# Patient Record
Sex: Female | Born: 1955 | Race: White | Hispanic: No | Marital: Married | Smoking: Never smoker
Health system: Southern US, Community
[De-identification: ages and names within clinical notes are randomized; demographics above are authoritative.]

## PROBLEM LIST (undated history)

## (undated) DIAGNOSIS — S0300XA Dislocation of jaw, unspecified side, initial encounter: Secondary | ICD-10-CM

## (undated) DIAGNOSIS — I10 Essential (primary) hypertension: Secondary | ICD-10-CM

## (undated) DIAGNOSIS — M3500 Sicca syndrome, unspecified: Secondary | ICD-10-CM

## (undated) DIAGNOSIS — F419 Anxiety disorder, unspecified: Secondary | ICD-10-CM

## (undated) DIAGNOSIS — I712 Thoracic aortic aneurysm, without rupture, unspecified: Secondary | ICD-10-CM

## (undated) DIAGNOSIS — F32A Depression, unspecified: Secondary | ICD-10-CM

## (undated) DIAGNOSIS — R0602 Shortness of breath: Secondary | ICD-10-CM

## (undated) DIAGNOSIS — I341 Nonrheumatic mitral (valve) prolapse: Secondary | ICD-10-CM

## (undated) HISTORY — DX: Anxiety disorder, unspecified: F41.9

## (undated) HISTORY — PX: APPENDECTOMY: SHX54

## (undated) HISTORY — DX: Shortness of breath: R06.02

## (undated) HISTORY — PX: INGUINAL HERNIA REPAIR: SUR1180

## (undated) HISTORY — DX: Depression, unspecified: F32.A

## (undated) HISTORY — PX: TUBAL LIGATION: SHX77

## (undated) HISTORY — DX: Dislocation of jaw, unspecified side, initial encounter: S03.00XA

## (undated) HISTORY — PX: DERMOID CYST  EXCISION: SHX1452

## (undated) HISTORY — PX: RIGHT OOPHORECTOMY: SHX2359

## (undated) HISTORY — DX: Essential (primary) hypertension: I10

## (undated) HISTORY — DX: Sjogren syndrome, unspecified: M35.00

## (undated) HISTORY — DX: Nonrheumatic mitral (valve) prolapse: I34.1

## (undated) HISTORY — PX: LEEP: SHX91

## (undated) HISTORY — DX: Thoracic aortic aneurysm, without rupture, unspecified: I71.20

## (undated) HISTORY — PX: OTHER SURGICAL HISTORY: SHX169

---

## 2018-03-29 DIAGNOSIS — G4700 Insomnia, unspecified: Secondary | ICD-10-CM | POA: Diagnosis not present

## 2018-03-29 DIAGNOSIS — F329 Major depressive disorder, single episode, unspecified: Secondary | ICD-10-CM | POA: Diagnosis not present

## 2018-03-29 DIAGNOSIS — F411 Generalized anxiety disorder: Secondary | ICD-10-CM | POA: Diagnosis not present

## 2018-04-05 DIAGNOSIS — M530 Cervicocranial syndrome: Secondary | ICD-10-CM | POA: Diagnosis not present

## 2018-04-19 DIAGNOSIS — M530 Cervicocranial syndrome: Secondary | ICD-10-CM | POA: Diagnosis not present

## 2018-07-04 DIAGNOSIS — Z01419 Encounter for gynecological examination (general) (routine) without abnormal findings: Secondary | ICD-10-CM | POA: Diagnosis not present

## 2018-07-04 DIAGNOSIS — Z6823 Body mass index (BMI) 23.0-23.9, adult: Secondary | ICD-10-CM | POA: Diagnosis not present

## 2018-12-14 DIAGNOSIS — Z79899 Other long term (current) drug therapy: Secondary | ICD-10-CM | POA: Diagnosis not present

## 2018-12-19 DIAGNOSIS — R51 Headache: Secondary | ICD-10-CM | POA: Diagnosis not present

## 2018-12-19 DIAGNOSIS — E538 Deficiency of other specified B group vitamins: Secondary | ICD-10-CM | POA: Diagnosis not present

## 2018-12-19 DIAGNOSIS — I10 Essential (primary) hypertension: Secondary | ICD-10-CM | POA: Diagnosis not present

## 2018-12-19 DIAGNOSIS — Z0000 Encounter for general adult medical examination without abnormal findings: Secondary | ICD-10-CM | POA: Diagnosis not present

## 2018-12-19 DIAGNOSIS — M3500 Sicca syndrome, unspecified: Secondary | ICD-10-CM | POA: Diagnosis not present

## 2019-01-01 DIAGNOSIS — N950 Postmenopausal bleeding: Secondary | ICD-10-CM | POA: Diagnosis not present

## 2019-01-01 DIAGNOSIS — N393 Stress incontinence (female) (male): Secondary | ICD-10-CM | POA: Diagnosis not present

## 2019-01-16 DIAGNOSIS — Z1231 Encounter for screening mammogram for malignant neoplasm of breast: Secondary | ICD-10-CM | POA: Diagnosis not present

## 2019-01-16 DIAGNOSIS — Z79890 Hormone replacement therapy (postmenopausal): Secondary | ICD-10-CM | POA: Diagnosis not present

## 2019-01-16 DIAGNOSIS — N950 Postmenopausal bleeding: Secondary | ICD-10-CM | POA: Diagnosis not present

## 2019-02-19 DIAGNOSIS — N8501 Benign endometrial hyperplasia: Secondary | ICD-10-CM | POA: Diagnosis not present

## 2019-02-19 DIAGNOSIS — N950 Postmenopausal bleeding: Secondary | ICD-10-CM | POA: Diagnosis not present

## 2019-05-29 DIAGNOSIS — N950 Postmenopausal bleeding: Secondary | ICD-10-CM | POA: Diagnosis not present

## 2019-05-29 DIAGNOSIS — Z79890 Hormone replacement therapy (postmenopausal): Secondary | ICD-10-CM | POA: Diagnosis not present

## 2019-05-29 DIAGNOSIS — Z6823 Body mass index (BMI) 23.0-23.9, adult: Secondary | ICD-10-CM | POA: Diagnosis not present

## 2019-10-24 DIAGNOSIS — Z23 Encounter for immunization: Secondary | ICD-10-CM | POA: Diagnosis not present

## 2019-11-22 DIAGNOSIS — H21263 Iris atrophy (essential) (progressive), bilateral: Secondary | ICD-10-CM | POA: Diagnosis not present

## 2019-11-22 DIAGNOSIS — H524 Presbyopia: Secondary | ICD-10-CM | POA: Diagnosis not present

## 2019-11-22 DIAGNOSIS — H04123 Dry eye syndrome of bilateral lacrimal glands: Secondary | ICD-10-CM | POA: Diagnosis not present

## 2019-11-22 DIAGNOSIS — M3500 Sicca syndrome, unspecified: Secondary | ICD-10-CM | POA: Diagnosis not present

## 2020-02-12 DIAGNOSIS — F330 Major depressive disorder, recurrent, mild: Secondary | ICD-10-CM | POA: Diagnosis not present

## 2020-02-12 DIAGNOSIS — F419 Anxiety disorder, unspecified: Secondary | ICD-10-CM | POA: Diagnosis not present

## 2020-02-12 DIAGNOSIS — M3500 Sicca syndrome, unspecified: Secondary | ICD-10-CM | POA: Diagnosis not present

## 2020-02-12 DIAGNOSIS — I10 Essential (primary) hypertension: Secondary | ICD-10-CM | POA: Diagnosis not present

## 2020-02-12 DIAGNOSIS — Z1389 Encounter for screening for other disorder: Secondary | ICD-10-CM | POA: Diagnosis not present

## 2020-04-16 DIAGNOSIS — Z6823 Body mass index (BMI) 23.0-23.9, adult: Secondary | ICD-10-CM | POA: Diagnosis not present

## 2020-04-16 DIAGNOSIS — M3501 Sicca syndrome with keratoconjunctivitis: Secondary | ICD-10-CM | POA: Diagnosis not present

## 2020-04-16 DIAGNOSIS — M3390 Dermatopolymyositis, unspecified, organ involvement unspecified: Secondary | ICD-10-CM | POA: Diagnosis not present

## 2020-04-16 DIAGNOSIS — M2550 Pain in unspecified joint: Secondary | ICD-10-CM | POA: Diagnosis not present

## 2020-04-16 DIAGNOSIS — Z79899 Other long term (current) drug therapy: Secondary | ICD-10-CM | POA: Diagnosis not present

## 2020-06-16 DIAGNOSIS — E782 Mixed hyperlipidemia: Secondary | ICD-10-CM | POA: Diagnosis not present

## 2020-06-16 DIAGNOSIS — Z0000 Encounter for general adult medical examination without abnormal findings: Secondary | ICD-10-CM | POA: Diagnosis not present

## 2020-06-23 DIAGNOSIS — I10 Essential (primary) hypertension: Secondary | ICD-10-CM | POA: Diagnosis not present

## 2020-06-23 DIAGNOSIS — Z23 Encounter for immunization: Secondary | ICD-10-CM | POA: Diagnosis not present

## 2020-06-23 DIAGNOSIS — R82998 Other abnormal findings in urine: Secondary | ICD-10-CM | POA: Diagnosis not present

## 2020-06-23 DIAGNOSIS — R7301 Impaired fasting glucose: Secondary | ICD-10-CM | POA: Diagnosis not present

## 2020-06-23 DIAGNOSIS — Z0000 Encounter for general adult medical examination without abnormal findings: Secondary | ICD-10-CM | POA: Diagnosis not present

## 2020-07-09 ENCOUNTER — Other Ambulatory Visit: Payer: Self-pay | Admitting: Internal Medicine

## 2020-07-09 DIAGNOSIS — Z1231 Encounter for screening mammogram for malignant neoplasm of breast: Secondary | ICD-10-CM

## 2020-07-09 DIAGNOSIS — M810 Age-related osteoporosis without current pathological fracture: Secondary | ICD-10-CM

## 2020-08-03 DIAGNOSIS — M19041 Primary osteoarthritis, right hand: Secondary | ICD-10-CM | POA: Diagnosis not present

## 2020-08-03 DIAGNOSIS — M2550 Pain in unspecified joint: Secondary | ICD-10-CM | POA: Diagnosis not present

## 2020-08-03 DIAGNOSIS — M19042 Primary osteoarthritis, left hand: Secondary | ICD-10-CM | POA: Diagnosis not present

## 2020-08-03 DIAGNOSIS — M3501 Sicca syndrome with keratoconjunctivitis: Secondary | ICD-10-CM | POA: Diagnosis not present

## 2020-08-21 DIAGNOSIS — H21263 Iris atrophy (essential) (progressive), bilateral: Secondary | ICD-10-CM | POA: Diagnosis not present

## 2020-08-21 DIAGNOSIS — H5212 Myopia, left eye: Secondary | ICD-10-CM | POA: Diagnosis not present

## 2020-08-21 DIAGNOSIS — H04123 Dry eye syndrome of bilateral lacrimal glands: Secondary | ICD-10-CM | POA: Diagnosis not present

## 2020-08-28 DIAGNOSIS — Z01419 Encounter for gynecological examination (general) (routine) without abnormal findings: Secondary | ICD-10-CM | POA: Diagnosis not present

## 2020-08-28 DIAGNOSIS — Z6824 Body mass index (BMI) 24.0-24.9, adult: Secondary | ICD-10-CM | POA: Diagnosis not present

## 2020-09-30 DIAGNOSIS — Z713 Dietary counseling and surveillance: Secondary | ICD-10-CM | POA: Diagnosis not present

## 2020-09-30 DIAGNOSIS — I10 Essential (primary) hypertension: Secondary | ICD-10-CM | POA: Diagnosis not present

## 2020-09-30 DIAGNOSIS — E782 Mixed hyperlipidemia: Secondary | ICD-10-CM | POA: Diagnosis not present

## 2020-10-09 ENCOUNTER — Other Ambulatory Visit: Payer: Self-pay | Admitting: Internal Medicine

## 2020-10-09 DIAGNOSIS — Z13820 Encounter for screening for osteoporosis: Secondary | ICD-10-CM

## 2020-10-12 DIAGNOSIS — E785 Hyperlipidemia, unspecified: Secondary | ICD-10-CM | POA: Diagnosis not present

## 2020-10-14 ENCOUNTER — Other Ambulatory Visit: Payer: Self-pay

## 2020-10-14 ENCOUNTER — Inpatient Hospital Stay: Admission: RE | Admit: 2020-10-14 | Payer: Self-pay | Source: Ambulatory Visit

## 2021-01-15 DIAGNOSIS — Z1231 Encounter for screening mammogram for malignant neoplasm of breast: Secondary | ICD-10-CM | POA: Diagnosis not present

## 2021-01-15 DIAGNOSIS — Z13820 Encounter for screening for osteoporosis: Secondary | ICD-10-CM | POA: Diagnosis not present

## 2021-03-11 DIAGNOSIS — I10 Essential (primary) hypertension: Secondary | ICD-10-CM | POA: Diagnosis not present

## 2021-04-06 DIAGNOSIS — M3501 Sicca syndrome with keratoconjunctivitis: Secondary | ICD-10-CM | POA: Diagnosis not present

## 2021-04-06 DIAGNOSIS — R21 Rash and other nonspecific skin eruption: Secondary | ICD-10-CM | POA: Diagnosis not present

## 2021-04-06 DIAGNOSIS — M150 Primary generalized (osteo)arthritis: Secondary | ICD-10-CM | POA: Diagnosis not present

## 2021-05-18 DIAGNOSIS — N950 Postmenopausal bleeding: Secondary | ICD-10-CM | POA: Diagnosis not present

## 2021-06-08 DIAGNOSIS — R059 Cough, unspecified: Secondary | ICD-10-CM | POA: Diagnosis not present

## 2021-06-08 DIAGNOSIS — R002 Palpitations: Secondary | ICD-10-CM | POA: Diagnosis not present

## 2021-06-08 DIAGNOSIS — I10 Essential (primary) hypertension: Secondary | ICD-10-CM | POA: Diagnosis not present

## 2021-09-02 DIAGNOSIS — E782 Mixed hyperlipidemia: Secondary | ICD-10-CM | POA: Diagnosis not present

## 2021-09-02 DIAGNOSIS — R7301 Impaired fasting glucose: Secondary | ICD-10-CM | POA: Diagnosis not present

## 2021-09-09 DIAGNOSIS — I10 Essential (primary) hypertension: Secondary | ICD-10-CM | POA: Diagnosis not present

## 2021-09-09 DIAGNOSIS — Z1331 Encounter for screening for depression: Secondary | ICD-10-CM | POA: Diagnosis not present

## 2021-09-09 DIAGNOSIS — Z0000 Encounter for general adult medical examination without abnormal findings: Secondary | ICD-10-CM | POA: Diagnosis not present

## 2021-09-09 DIAGNOSIS — Z1339 Encounter for screening examination for other mental health and behavioral disorders: Secondary | ICD-10-CM | POA: Diagnosis not present

## 2021-09-14 ENCOUNTER — Other Ambulatory Visit: Payer: Self-pay | Admitting: Internal Medicine

## 2021-09-14 DIAGNOSIS — E782 Mixed hyperlipidemia: Secondary | ICD-10-CM

## 2021-09-22 DIAGNOSIS — Z01419 Encounter for gynecological examination (general) (routine) without abnormal findings: Secondary | ICD-10-CM | POA: Diagnosis not present

## 2021-09-22 DIAGNOSIS — N950 Postmenopausal bleeding: Secondary | ICD-10-CM | POA: Diagnosis not present

## 2021-09-22 DIAGNOSIS — Z6824 Body mass index (BMI) 24.0-24.9, adult: Secondary | ICD-10-CM | POA: Diagnosis not present

## 2021-10-04 DIAGNOSIS — R21 Rash and other nonspecific skin eruption: Secondary | ICD-10-CM | POA: Diagnosis not present

## 2021-10-04 DIAGNOSIS — M1991 Primary osteoarthritis, unspecified site: Secondary | ICD-10-CM | POA: Diagnosis not present

## 2021-10-04 DIAGNOSIS — R1319 Other dysphagia: Secondary | ICD-10-CM | POA: Diagnosis not present

## 2021-10-04 DIAGNOSIS — M3501 Sicca syndrome with keratoconjunctivitis: Secondary | ICD-10-CM | POA: Diagnosis not present

## 2021-10-06 ENCOUNTER — Other Ambulatory Visit: Payer: Self-pay

## 2021-10-06 ENCOUNTER — Ambulatory Visit
Admission: RE | Admit: 2021-10-06 | Discharge: 2021-10-06 | Disposition: A | Discharge status: Home / Self Care | Payer: No Typology Code available for payment source | Source: Ambulatory Visit | Attending: Internal Medicine | Admitting: Internal Medicine

## 2021-10-06 DIAGNOSIS — E782 Mixed hyperlipidemia: Secondary | ICD-10-CM

## 2021-10-07 DIAGNOSIS — H6121 Impacted cerumen, right ear: Secondary | ICD-10-CM | POA: Diagnosis not present

## 2021-10-25 DIAGNOSIS — H5212 Myopia, left eye: Secondary | ICD-10-CM | POA: Diagnosis not present

## 2021-10-25 DIAGNOSIS — H21263 Iris atrophy (essential) (progressive), bilateral: Secondary | ICD-10-CM | POA: Diagnosis not present

## 2021-10-28 ENCOUNTER — Encounter: Payer: Self-pay | Admitting: Cardiovascular Disease

## 2021-10-28 ENCOUNTER — Ambulatory Visit: Payer: BC Managed Care – PPO | Admitting: Cardiovascular Disease

## 2021-10-28 VITALS — BP 126/84 | HR 72 | Ht 67.5 in | Wt 152.0 lb

## 2021-10-28 DIAGNOSIS — I2510 Atherosclerotic heart disease of native coronary artery without angina pectoris: Secondary | ICD-10-CM | POA: Diagnosis not present

## 2021-10-28 DIAGNOSIS — I7121 Aneurysm of the ascending aorta, without rupture: Secondary | ICD-10-CM

## 2021-10-28 NOTE — Patient Instructions (Signed)
Medication Instructions:  ?No changes ?*If you need a refill on your cardiac medications before your next appointment, please call your pharmacy* ? ? ?Lab Work: ?none ?If you have labs (blood work) drawn today and your tests are completely normal, you will receive your results only by: ?MyChart Message (if you have MyChart) OR ?A paper copy in the mail ?If you have any lab test that is abnormal or we need to change your treatment, we will call you to review the results. ? ? ?Testing/Procedures: ?Your physician has requested that you have an echocardiogram. Echocardiography is a painless test that uses sound waves to create images of your heart. It provides your doctor with information about the size and shape of your heart and how well your heart?s chambers and valves are working. This procedure takes approximately one hour. There are no restrictions for this procedure. ? ?Your physician has requested that you have an exercise tolerance test. For further information please visit [email or web address] Please also follow instruction sheet, as given. ? ? ? ?Follow-Up: ?At [facility], you and your health needs are our priority.  As part of our continuing mission to provide you with exceptional heart care, we have created designated Provider Care Teams.  These Care Teams include your primary Cardiologist (physician) and Advanced Practice Providers (APPs -  Physician Assistants and Nurse Practitioners) who all work together to provide you with the care you need, when you need it. ? ?We recommend signing up for the patient portal called "MyChart".  Sign up information is provided on this After Visit Summary.  MyChart is used to connect with patients for Virtual Visits (Telemedicine).  Patients are able to view lab/test results, encounter notes, upcoming appointments, etc.  Non-urgent messages can be sent to your provider as well.   ?To learn more about what you can do with MyChart, go to [email or web address].    ? ?Your next appointment:   ?2 -3 month(s) ? ?The format for your next appointment:   ?In Person ? ?Provider:   ?Verne Carrow, MD   ? ? ?Other Instructions ?  ?

## 2021-10-28 NOTE — Progress Notes (Signed)
? ?Chief Complaint  ?Patient presents with  ? New Patient (Initial Visit)  ?  Abnormal CT calcium score  ? ?History of Present Illness: 66 yo female with history of anxiety, depression, HTN, mitral valve prolapse and dilated ascending aorta who is here today as a new patient for the evaluation of abnormal coronary calcium score. CT coronary calcium score of 195 with mild dilation of the ascending aorta at 4.1 cm. She tells me today that she had Covid 19 in September 2022 and had some chest pain. Her anxiety level went up. She feels well now. She started taking Crestor 20 mg last week but felt poorly so she cut back to 10 mg per day and feels ok. No exertional chest pain or dyspnea. No LE edema or dizziness.  ? ?Primary Care Physician: Michael Boston, MD ? ?Past Medical History:  ?Diagnosis Date  ? Anxiety   ? Depression   ? HTN (hypertension)   ? MVP (mitral valve prolapse)   ? Sjogren's syndrome ([place])   ? SOB (shortness of breath)   ? Thoracic aortic aneurysm without rupture, unspecified part ([place])   ? TMJ (dislocation of temporomandibular joint)   ? ? ?Past Surgical History:  ?Procedure Laterality Date  ? APPENDECTOMY    ? CESAREAN SECTION    ? DERMOID CYST  EXCISION    ? INGUINAL HERNIA REPAIR    ? LEEP    ? RIGHT OOPHORECTOMY    ? TONSILLECTOMY    ? TUBAL LIGATION    ? ? ?Current Outpatient Medications  ?Medication Sig Dispense Refill  ? aspirin-acetaminophen-caffeine (EXCEDRIN EXTRA STRENGTH) 250-250-65 MG tablet Take 1 tablet by mouth every morning.    ? estradiol (VIVELLE-DOT) 0.025 MG/24HR Place 1 patch onto the skin 2 (two) times a week.    ? lisinopril (ZESTRIL) 20 MG tablet Take 20 mg by mouth daily.    ? Multiple Vitamins-Minerals (MULTIVITAMIN WOMENS 50+ ADV PO) Take by mouth.    ? progesterone (PROMETRIUM) 100 MG capsule Take 100 mg by mouth daily.    ? rosuvastatin (CRESTOR) 20 MG tablet Take 10 mg by mouth at bedtime.    ? ?No current facility-administered medications for this visit.  ? ? ?Allergies   ?Allergen Reactions  ? Sulfa Antibiotics   ? ? ?Social History  ? ?Socioeconomic History  ? Marital status: Married  ?  Spouse name: Not on file  ? Number of children: 2  ? Years of education: Not on file  ? Highest education level: Not on file  ?Occupational History  ? Occupation: Retired-[employer]  ?Tobacco Use  ? Smoking status: Never  ? Smokeless tobacco: Never  ?Substance and Sexual Activity  ? Alcohol use: Yes  ?  Alcohol/week: 1.0 standard drink  ?  Types: 1 Glasses of wine per week  ?  Comment: 1 glass of red wine per day  ? Drug use: Never  ? Sexual activity: Not on file  ?Other Topics Concern  ? Not on file  ?Social History Narrative  ? Not on file  ? ?Social Determinants of Health  ? ?Financial Resource Strain: Not on file  ?Food Insecurity: Not on file  ?Transportation Needs: Not on file  ?Physical Activity: Not on file  ?Stress: Not on file  ?Social Connections: Not on file  ?Intimate Partner Violence: Not on file  ? ? ?Family History  ?Problem Relation Age of Onset  ? Hypertension Mother   ? Diabetes Mother   ? Stroke Mother   ?  Heart failure Mother   ? Hypertension Father   ? Dementia Father   ? ? ?Review of Systems:  As stated in the HPI and otherwise negative.  ? ?BP 126/84   Pulse 72   Ht 5' 7.5" (1.715 m)   Wt 152 lb (68.9 kg)   SpO2 97%   BMI 23.46 kg/m?  ? ?Physical Examination: ?General: Well developed, well nourished, NAD  ?HEENT: OP clear, mucus membranes moist  ?SKIN: warm, dry. No rashes. ?Neuro: No focal deficits  ?Musculoskeletal: Muscle strength 5/5 all ext  ?Psychiatric: Mood and affect normal  ?Neck: No JVD, no carotid bruits, no thyromegaly, no lymphadenopathy.  ?Lungs:Clear bilaterally, no wheezes, rhonci, crackles ?Cardiovascular: Regular rate and rhythm. No murmurs, gallops or rubs. ?Abdomen:Soft. Bowel sounds present. Non-tender.  ?Extremities: No lower extremity edema. Pulses are 2 + in the bilateral DP/PT. ? ?EKG:  EKG is ordered today. ?The ekg ordered today demonstrates  sinus ? ?Coronary CT Calcium score 10/06/21: ?FINDINGS: ?CORONARY CALCIUM SCORES: ?  ?Left Main: 0 ?  ?LAD: 195 ?  ?LCx: 0 ?  ?RCA: 0 ?  ?Total Agatston Score: 195 ?  ?MESA database percentile: 90 ?  ?AORTA MEASUREMENTS: ?  ?Ascending Aorta: 41 mm ?  ?Descending Aorta: 26 mm ?  ?OTHER FINDINGS: ?  ?The heart size is within normal limits. No pericardial fluid is ?identified. The ascending thoracic aorta demonstrates aneurysmal ?disease with maximum caliber of approximately 4.1 cm. Visualized ?central pulmonary arteries are normal in caliber. Visualized ?mediastinum and hilar regions demonstrate no lymphadenopathy or ?masses. Small hiatal hernia present. Visualized lungs show no ?evidence of pulmonary edema, consolidation, pneumothorax, nodule or ?pleural fluid. Visualized upper abdomen and bony structures are ?unremarkable. ?  ?IMPRESSION: ?1. Coronary calcium score of 195 is at the 90th percentile for the ?patient's age, sex and race. ?2. Aneurysmal disease of the ascending thoracic aorta with measured ?maximal diameter of approximately 4.1 cm. Consider correlation with ?echocardiography. Recommend annual imaging followup by CTA or MRA. ?This recommendation follows 2010 ?ACCF/AHA/AATS/ACR/ASA/SCA/SCAI/SIR/STS/SVM Guidelines for the ?Diagnosis and Management of Patients with Thoracic Aortic Disease. ?Circulation. 2010; 121JN:9224643. Aortic aneurysm NOS (ICD10-I71.9) ? ?Recent Labs: ?No results found for requested labs within last 8760 hours.  ? ?Lipid Panel ?No results found for: CHOL, TRIG, HDL, CHOLHDL, VLDL, LDLCALC, LDLDIRECT ?  ?Wt Readings from Last 3 Encounters:  ?10/28/21 152 lb (68.9 kg)  ?  ? ? ?Assessment and Plan:  ? ?1. CAD without angina: Abnormal coronary calcium score. She has no symptoms suggestive of angina. Will arrange an echo to assess LVEF and exclude structural heart disease. Exercise treadmill stress test to assess exercise tolerance and exclude ischemia.  ? ?2. Thoracic aortic aneurysm:  Chest CTA in one year.  ? ?Labs/ tests ordered today include:  ? ?Orders Placed This Encounter  ?Procedures  ? Exercise Tolerance Test  ? EKG 12-Lead  ? ECHOCARDIOGRAM COMPLETE  ? ?Disposition:   F/U with me in 8-12 weeks.  ? ? ?Signed, ?Lauree Chandler, MD ?10/28/2021 10:52 AM    ?[facility] ?[address], [place], [place]  [address] ?Phone: [phone]; Fax: [phone]  ? ? ?

## 2021-11-18 ENCOUNTER — Ambulatory Visit (INDEPENDENT_AMBULATORY_CARE_PROVIDER_SITE_OTHER): Payer: BC Managed Care – PPO

## 2021-11-18 ENCOUNTER — Ambulatory Visit (HOSPITAL_COMMUNITY): Payer: BC Managed Care – PPO | Attending: Cardiology

## 2021-11-18 DIAGNOSIS — I2510 Atherosclerotic heart disease of native coronary artery without angina pectoris: Secondary | ICD-10-CM | POA: Diagnosis not present

## 2021-11-18 DIAGNOSIS — I7121 Aneurysm of the ascending aorta, without rupture: Secondary | ICD-10-CM

## 2021-11-18 LAB — ECHOCARDIOGRAM COMPLETE
Area-P 1/2: 4.49 cm2
S' Lateral: 2.7 cm

## 2021-11-18 LAB — EXERCISE TOLERANCE TEST
Angina Index: 0
Base ST Depression (mm): 0 mm
Duke Treadmill Score: 7
Estimated workload: 8.5
Exercise duration (min): 7 min
Exercise duration (sec): 1 s
MPHR: 159 {beats}/min
Peak HR: 137 {beats}/min
Percent HR: 87 %
RPE: 17
Rest HR: 71 {beats}/min
ST Depression (mm): 0 mm

## 2021-11-24 ENCOUNTER — Telehealth: Payer: Self-pay | Admitting: *Deleted

## 2021-11-24 DIAGNOSIS — I7121 Aneurysm of the ascending aorta, without rupture: Secondary | ICD-10-CM

## 2021-11-24 DIAGNOSIS — H6121 Impacted cerumen, right ear: Secondary | ICD-10-CM | POA: Diagnosis not present

## 2021-11-24 DIAGNOSIS — H938X2 Other specified disorders of left ear: Secondary | ICD-10-CM | POA: Diagnosis not present

## 2021-11-24 MED ORDER — ROSUVASTATIN CALCIUM 10 MG PO TABS: 10.0000 mg | ORAL_TABLET | Freq: Every day | ORAL | 3 refills | Status: DC

## 2021-11-24 NOTE — Telephone Encounter (Signed)
GXT and echo results reviewed with patient and her husband.  She would like results to be sent to her PCP and requests Dr. Clifton James refill her rosuvastatin 10 mg.  She was started on 20 mg and not tolerating and so cut in half and tolerating this dose.   Will have lipids checked in June or July. ? ?Aware we will plan for a ct chest aorta in one year.  She denies any exertional chest pain or dyspnea. ?

## 2021-11-24 NOTE — Telephone Encounter (Signed)
-----   Message from Kathleene Hazel, MD sent at 11/21/2021  6:15 PM EDT ----- ?Her heart is strong. No significant valve disease. Mild aortic root dilation which was also noted on her CT chest. Will need chest CTA in one year.  ?

## 2021-11-29 ENCOUNTER — Other Ambulatory Visit: Payer: Self-pay | Admitting: Physician Assistant

## 2021-11-29 DIAGNOSIS — R1310 Dysphagia, unspecified: Secondary | ICD-10-CM

## 2021-11-29 DIAGNOSIS — M3500 Sicca syndrome, unspecified: Secondary | ICD-10-CM | POA: Diagnosis not present

## 2021-12-10 ENCOUNTER — Ambulatory Visit
Admission: RE | Admit: 2021-12-10 | Discharge: 2021-12-10 | Disposition: A | Discharge status: Home / Self Care | Payer: BC Managed Care – PPO | Source: Ambulatory Visit | Attending: Physician Assistant | Admitting: Physician Assistant

## 2021-12-10 DIAGNOSIS — K449 Diaphragmatic hernia without obstruction or gangrene: Secondary | ICD-10-CM | POA: Diagnosis not present

## 2021-12-10 DIAGNOSIS — R1310 Dysphagia, unspecified: Secondary | ICD-10-CM | POA: Diagnosis not present

## 2022-01-17 DIAGNOSIS — Z1231 Encounter for screening mammogram for malignant neoplasm of breast: Secondary | ICD-10-CM | POA: Diagnosis not present

## 2022-01-21 DIAGNOSIS — R7301 Impaired fasting glucose: Secondary | ICD-10-CM | POA: Diagnosis not present

## 2022-01-21 DIAGNOSIS — E782 Mixed hyperlipidemia: Secondary | ICD-10-CM | POA: Diagnosis not present

## 2022-01-28 ENCOUNTER — Ambulatory Visit: Payer: BC Managed Care – PPO | Admitting: Cardiovascular Disease

## 2022-02-04 ENCOUNTER — Ambulatory Visit: Payer: BC Managed Care – PPO | Admitting: Cardiovascular Disease

## 2022-02-11 DIAGNOSIS — I10 Essential (primary) hypertension: Secondary | ICD-10-CM | POA: Diagnosis not present

## 2022-02-16 ENCOUNTER — Telehealth: Payer: Self-pay | Admitting: *Deleted

## 2022-02-16 NOTE — Telephone Encounter (Signed)
Called and left message for patient.  Calling to move up her next appointment in Oct to Aug 7 at 4:20 pm.  She is due now for a 3 mo follow up.  Asked that she call back or send a message in MyChart to confirm/schedule appointment.

## 2022-02-18 NOTE — Telephone Encounter (Signed)
Pt confirmed appointment 02/28/22.

## 2022-02-28 ENCOUNTER — Ambulatory Visit: Payer: BC Managed Care – PPO | Admitting: Cardiovascular Disease

## 2022-02-28 ENCOUNTER — Encounter: Payer: Self-pay | Admitting: Cardiovascular Disease

## 2022-02-28 VITALS — BP 128/70 | HR 84 | Ht 67.5 in | Wt 155.4 lb

## 2022-02-28 DIAGNOSIS — I7121 Aneurysm of the ascending aorta, without rupture: Secondary | ICD-10-CM | POA: Diagnosis not present

## 2022-02-28 DIAGNOSIS — I2510 Atherosclerotic heart disease of native coronary artery without angina pectoris: Secondary | ICD-10-CM

## 2022-02-28 DIAGNOSIS — Z01812 Encounter for preprocedural laboratory examination: Secondary | ICD-10-CM

## 2022-02-28 NOTE — Patient Instructions (Signed)
Medication Instructions:  No changes *If you need a refill on your cardiac medications before your next appointment, please call your pharmacy*   Lab Work: none If you have labs (blood work) drawn today and your tests are completely normal, you will receive your results only by: MyChart Message (if you have MyChart) OR A paper copy in the mail If you have any lab test that is abnormal or we need to change your treatment, we will call you to review the results.   Testing/Procedures: CT Chest aorta as scheduled - March 2024   Follow-Up: At [facility], you and your health needs are our priority.  As part of our continuing mission to provide you with exceptional heart care, we have created designated Provider Care Teams.  These Care Teams include your primary Cardiologist (physician) and Advanced Practice Providers (APPs -  Physician Assistants and Nurse Practitioners) who all work together to provide you with the care you need, when you need it.   Your next appointment:   12 month(s)  The format for your next appointment:   In Person  Provider:   Verne Carrow, MD      Important Information About Sugar

## 2022-02-28 NOTE — Progress Notes (Signed)
Chief Complaint  Patient presents with   Follow-up    CAD   History of Present Illness: 66 yo female with history of CAD by chest CT, anxiety, depression, HTN, mitral valve prolapse and dilated ascending aorta who is here today for follow up. I saw her as a new patient in April 2023 for the evaluation of abnormal coronary calcium score. CT coronary calcium score of 195 with mild dilation of the ascending aorta at 4.1 cm. She described chest pain in the setting of Covid 19 infection in September 2022. No exertional chest pain or dyspnea. No LE edema or dizziness. Echo April 2023 with LVEF=55-60%, grade 1 diastolic dysfunction, normal RV function. Trivial MR. Trivial AI. Mild dilation of the aortic root. Normal exercise stress test.   She is here today for follow up. The patient denies any chest pain, dyspnea, palpitations, lower extremity edema, orthopnea, PND, dizziness, near syncope or syncope. She wakes up occasionally and feels her heart beating heavily but not pounding.   Primary Care Physician: Melida Quitter, MD  Past Medical History:  Diagnosis Date   Anxiety    Depression    HTN (hypertension)    MVP (mitral valve prolapse)    Sjogren's syndrome ([facility])    SOB (shortness of breath)    Thoracic aortic aneurysm without rupture, unspecified part ([facility])    TMJ (dislocation of temporomandibular joint)     Past Surgical History:  Procedure Laterality Date   APPENDECTOMY     CESAREAN SECTION     DERMOID CYST  EXCISION     INGUINAL HERNIA REPAIR     LEEP     RIGHT OOPHORECTOMY     TONSILLECTOMY     TUBAL LIGATION      Current Outpatient Medications  Medication Sig Dispense Refill   aspirin-acetaminophen-caffeine (EXCEDRIN EXTRA STRENGTH) 250-250-65 MG tablet Take 1 tablet by mouth every morning.     lisinopril (ZESTRIL) 20 MG tablet Take 20 mg by mouth daily.     rosuvastatin (CRESTOR) 10 MG tablet Take 1 tablet (10 mg total) by mouth daily. 90 tablet 3   No current  facility-administered medications for this visit.    Allergies  Allergen Reactions   Sulfa Antibiotics     Social History   Socioeconomic History   Marital status: Married    Spouse name: Not on file   Number of children: 2   Years of education: Not on file   Highest education level: Not on file  Occupational History   Occupation: Retired-[employer]  Tobacco Use   Smoking status: Never   Smokeless tobacco: Never  Substance and Sexual Activity   Alcohol use: Yes    Alcohol/week: 1.0 standard drink of alcohol    Types: 1 Glasses of wine per week    Comment: 1 glass of red wine per day   Drug use: Never   Sexual activity: Not on file  Other Topics Concern   Not on file  Social History Narrative   Not on file   Social Determinants of Health   Financial Resource Strain: Not on file  Food Insecurity: Not on file  Transportation Needs: Not on file  Physical Activity: Not on file  Stress: Not on file  Social Connections: Not on file  Intimate Partner Violence: Not on file    Family History  Problem Relation Age of Onset   Hypertension Mother    Diabetes Mother    Stroke Mother    Heart failure Mother  Hypertension Father    Dementia Father     Review of Systems:  As stated in the HPI and otherwise negative.   BP 128/70   Pulse 84   Ht 5' 7.5" (1.715 m)   Wt 155 lb 6.4 oz (70.5 kg)   SpO2 98%   BMI 23.98 kg/m   Physical Examination:  General: Well developed, well nourished, NAD  HEENT: OP clear, mucus membranes moist  SKIN: warm, dry. No rashes. Neuro: No focal deficits  Musculoskeletal: Muscle strength 5/5 all ext  Psychiatric: Mood and affect normal  Neck: No JVD, no carotid bruits, no thyromegaly, no lymphadenopathy.  Lungs:Clear bilaterally, no wheezes, rhonci, crackles Cardiovascular: Regular rate and rhythm. No murmurs, gallops or rubs. Abdomen:Soft. Bowel sounds present. Non-tender.  Extremities: No lower extremity edema. Pulses are 2 + in the  bilateral DP/PT.  EKG:  EKG is not ordered today. The ekg ordered today demonstrates   Coronary CT Calcium score 10/06/21: FINDINGS: CORONARY CALCIUM SCORES:   Left Main: 0   LAD: 195   LCx: 0   RCA: 0   Total Agatston Score: 195   MESA database percentile: 90   AORTA MEASUREMENTS:   Ascending Aorta: 41 mm   Descending Aorta: 26 mm   OTHER FINDINGS:   The heart size is within normal limits. No pericardial fluid is identified. The ascending thoracic aorta demonstrates aneurysmal disease with maximum caliber of approximately 4.1 cm. Visualized central pulmonary arteries are normal in caliber. Visualized mediastinum and hilar regions demonstrate no lymphadenopathy or masses. Small hiatal hernia present. Visualized lungs show no evidence of pulmonary edema, consolidation, pneumothorax, nodule or pleural fluid. Visualized upper abdomen and bony structures are unremarkable.   IMPRESSION: 1. Coronary calcium score of 195 is at the 90th percentile for the patient's age, sex and race. 2. Aneurysmal disease of the ascending thoracic aorta with measured maximal diameter of approximately 4.1 cm. Consider correlation with echocardiography. Recommend annual imaging followup by CTA or MRA. This recommendation follows 2010 ACCF/AHA/AATS/ACR/ASA/SCA/SCAI/SIR/STS/SVM Guidelines for the Diagnosis and Management of Patients with Thoracic Aortic Disease. Circulation. 2010; 121: D322-G254. Aortic aneurysm NOS (ICD10-I71.9)  Echo April 2023:  1. Left ventricular ejection fraction, by estimation, is 55 to 60%. The  left ventricle has normal function. The left ventricle has no regional  wall motion abnormalities. Left ventricular diastolic parameters are  consistent with Grade I diastolic  dysfunction (impaired relaxation). The average left ventricular global  longitudinal strain is -19.7 %. The global longitudinal strain is normal.   2. Right ventricular systolic function is normal.  The right ventricular  size is normal. Tricuspid regurgitation signal is inadequate for assessing  PA pressure.   3. The mitral valve is normal in structure. Trivial mitral valve  regurgitation. No evidence of mitral stenosis.   4. The aortic valve is tricuspid. Aortic valve regurgitation is trivial.  No aortic stenosis is present.   5. Aortic dilatation noted. There is mild dilatation of the aortic root  and of the ascending aorta, measuring 41 mm.   6. The inferior vena cava is normal in size with greater than 50%  respiratory variability, suggesting right atrial pressure of 3 mmHg.  Recent Labs: No results found for requested labs within last 365 days.   Lipid Panel No results found for: "CHOL", "TRIG", "HDL", "CHOLHDL", "VLDL", "LDLCALC", "LDLDIRECT"   Wt Readings from Last 3 Encounters:  02/28/22 155 lb 6.4 oz (70.5 kg)  10/28/21 152 lb (68.9 kg)    Assessment and Plan:  1. CAD without angina: Abnormal coronary calcium score. Echo with normal LV function. Exercise stress test without ischemia. No exertional chest pain.  Continue ASA and statin.   2. Thoracic aortic aneurysm: Ascending aorta 4.1 cm by chest CT in March 2023. Repeat chest CTA in March 2024.   Labs/ tests ordered today include:   Orders Placed This Encounter  Procedures   Basic metabolic panel   Disposition:   F/U with me in 12 months   Signed, Verne Carrow, MD 02/28/2022 4:32 PM    [facility] [address] [place], [place], [place]  [address] Phone: [phone]; Fax: [phone]

## 2022-05-11 ENCOUNTER — Ambulatory Visit: Payer: BC Managed Care – PPO | Admitting: Cardiovascular Disease

## 2022-06-09 DIAGNOSIS — K219 Gastro-esophageal reflux disease without esophagitis: Secondary | ICD-10-CM | POA: Diagnosis not present

## 2022-07-06 DIAGNOSIS — R933 Abnormal findings on diagnostic imaging of other parts of digestive tract: Secondary | ICD-10-CM | POA: Diagnosis not present

## 2022-07-06 DIAGNOSIS — K219 Gastro-esophageal reflux disease without esophagitis: Secondary | ICD-10-CM | POA: Diagnosis not present

## 2022-07-08 DIAGNOSIS — K2289 Other specified disease of esophagus: Secondary | ICD-10-CM | POA: Diagnosis not present

## 2022-07-08 DIAGNOSIS — K449 Diaphragmatic hernia without obstruction or gangrene: Secondary | ICD-10-CM | POA: Diagnosis not present

## 2022-07-08 DIAGNOSIS — K2930 Chronic superficial gastritis without bleeding: Secondary | ICD-10-CM | POA: Diagnosis not present

## 2022-07-08 DIAGNOSIS — K219 Gastro-esophageal reflux disease without esophagitis: Secondary | ICD-10-CM | POA: Diagnosis not present

## 2022-07-08 DIAGNOSIS — K259 Gastric ulcer, unspecified as acute or chronic, without hemorrhage or perforation: Secondary | ICD-10-CM | POA: Diagnosis not present

## 2022-07-08 DIAGNOSIS — R1310 Dysphagia, unspecified: Secondary | ICD-10-CM | POA: Diagnosis not present

## 2022-08-25 DIAGNOSIS — K219 Gastro-esophageal reflux disease without esophagitis: Secondary | ICD-10-CM | POA: Diagnosis not present

## 2022-08-25 DIAGNOSIS — I10 Essential (primary) hypertension: Secondary | ICD-10-CM | POA: Diagnosis not present

## 2022-08-25 DIAGNOSIS — R7301 Impaired fasting glucose: Secondary | ICD-10-CM | POA: Diagnosis not present

## 2022-08-26 DIAGNOSIS — D72819 Decreased white blood cell count, unspecified: Secondary | ICD-10-CM | POA: Diagnosis not present

## 2022-09-01 DIAGNOSIS — I712 Thoracic aortic aneurysm, without rupture, unspecified: Secondary | ICD-10-CM | POA: Diagnosis not present

## 2022-09-01 DIAGNOSIS — Z1331 Encounter for screening for depression: Secondary | ICD-10-CM | POA: Diagnosis not present

## 2022-09-01 DIAGNOSIS — I10 Essential (primary) hypertension: Secondary | ICD-10-CM | POA: Diagnosis not present

## 2022-09-01 DIAGNOSIS — Z0000 Encounter for general adult medical examination without abnormal findings: Secondary | ICD-10-CM | POA: Diagnosis not present

## 2022-09-01 DIAGNOSIS — Z1339 Encounter for screening examination for other mental health and behavioral disorders: Secondary | ICD-10-CM | POA: Diagnosis not present

## 2022-09-12 DIAGNOSIS — H524 Presbyopia: Secondary | ICD-10-CM | POA: Diagnosis not present

## 2022-09-12 DIAGNOSIS — H2512 Age-related nuclear cataract, left eye: Secondary | ICD-10-CM | POA: Diagnosis not present

## 2022-09-15 DIAGNOSIS — K219 Gastro-esophageal reflux disease without esophagitis: Secondary | ICD-10-CM | POA: Diagnosis not present

## 2022-10-10 ENCOUNTER — Other Ambulatory Visit (HOSPITAL_BASED_OUTPATIENT_CLINIC_OR_DEPARTMENT_OTHER): Payer: BC Managed Care – PPO

## 2022-10-10 ENCOUNTER — Inpatient Hospital Stay: Admission: RE | Admit: 2022-10-10 | Payer: BC Managed Care – PPO | Source: Ambulatory Visit

## 2022-10-20 ENCOUNTER — Ambulatory Visit (HOSPITAL_BASED_OUTPATIENT_CLINIC_OR_DEPARTMENT_OTHER): Admission: RE | Admit: 2022-10-20 | Payer: BC Managed Care – PPO | Source: Ambulatory Visit

## 2022-11-18 IMAGING — CT CT CARDIAC CORONARY ARTERY CALCIUM SCORE
2 of 3 series · 9 of 20 positions shown, 11 images · non-contrast
Comparison: None.

CLINICAL DATA: 65-year-old Caucasian female with history of
hyperlipidemia, hypertension and family history of heart disease.

EXAM:
CT CARDIAC CORONARY ARTERY CALCIUM SCORE
TECHNIQUE: Non-contrast imaging through the heart was performed using
prospective ECG gating. Image post processing was performed on an
independent workstation, allowing for quantitative analysis of the
heart and coronary arteries. Note that this exam targets the heart
and the chest was not imaged in its entirety.

[Series 3: calcium scoring 2.00 br40 bestdiast 70% axial · axial · 0.47mm/px · z∈[+1476,+1596]mm · 5 of 90 slices shown, 7 images]
[im 15/90  vessel]
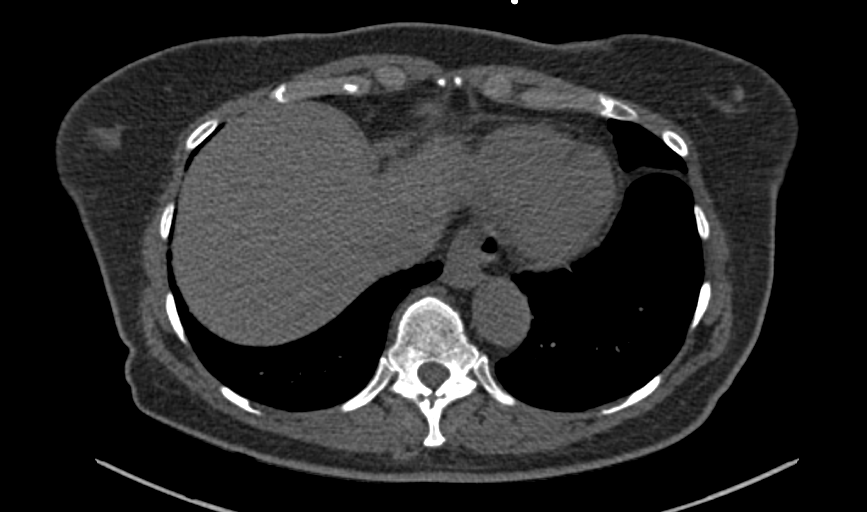
[im 15/90  lung]
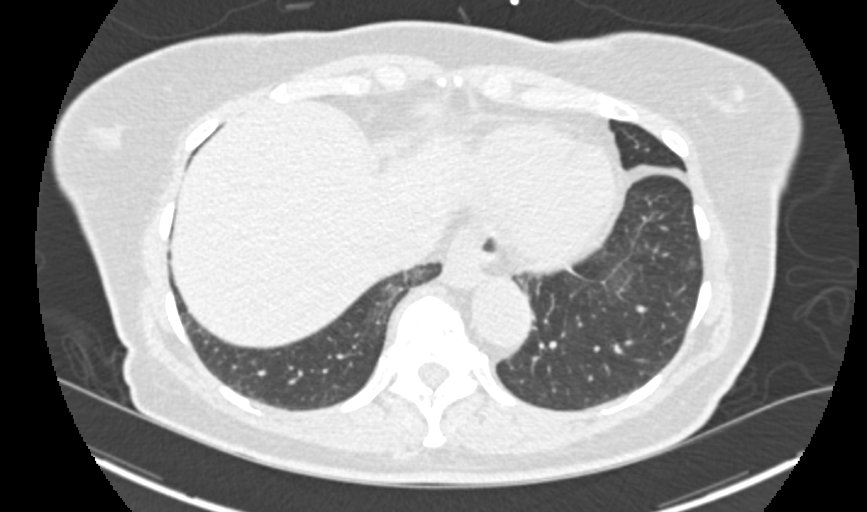
[im 30/90  vessel]
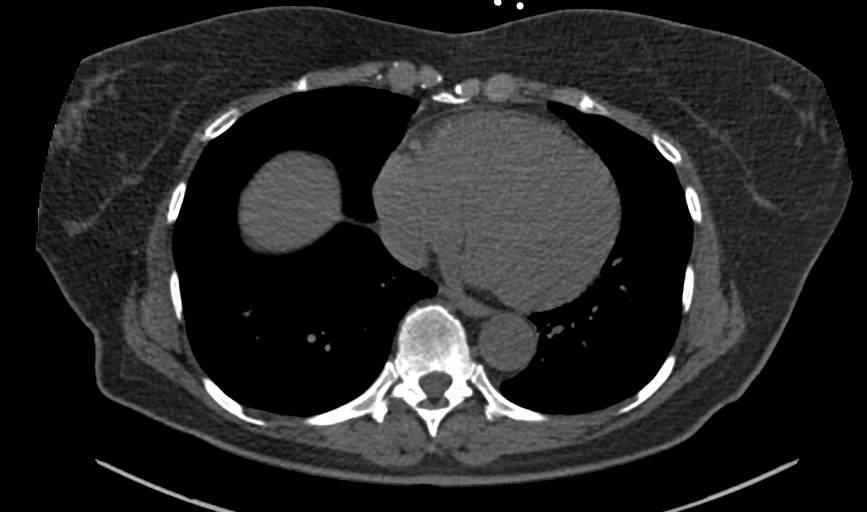
[im 45/90  vessel]
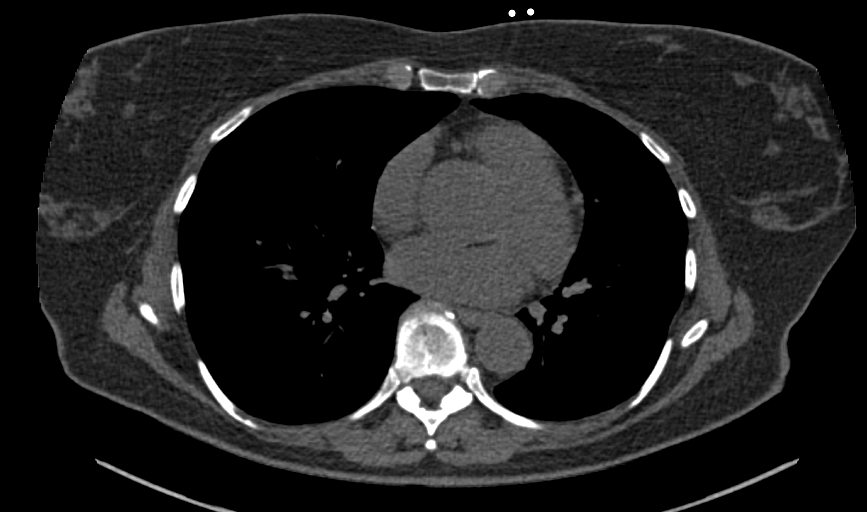
[im 60/90  vessel]
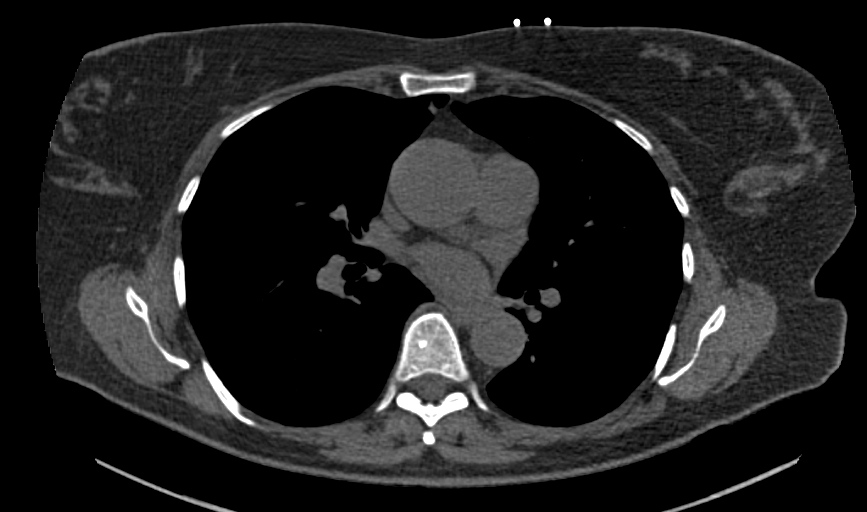
[im 75/90  vessel]
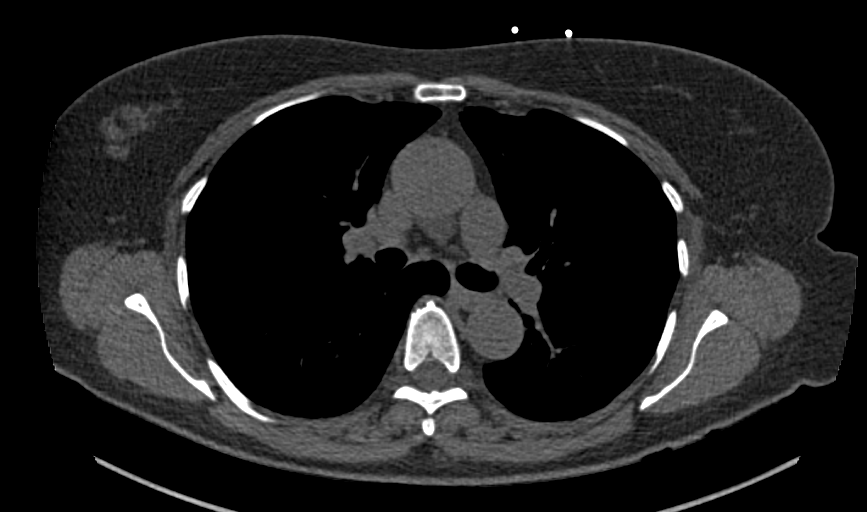
[im 75/90  lung]
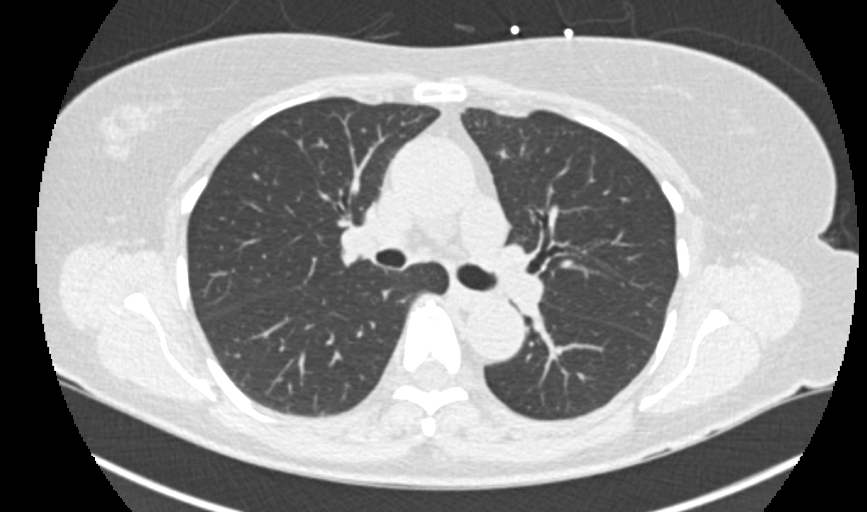

[Series 9: calcium scoring 2.00 br60 bestdiast 70% lungs · axial · 0.47mm/px · z∈[+1476,+1566]mm · 4 of 90 slices shown]
[im 15/90  vessel]
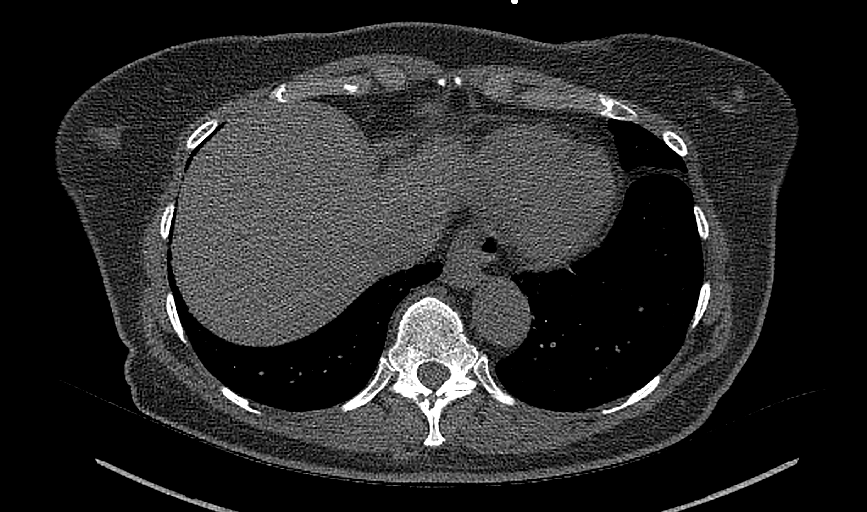
[im 30/90  vessel]
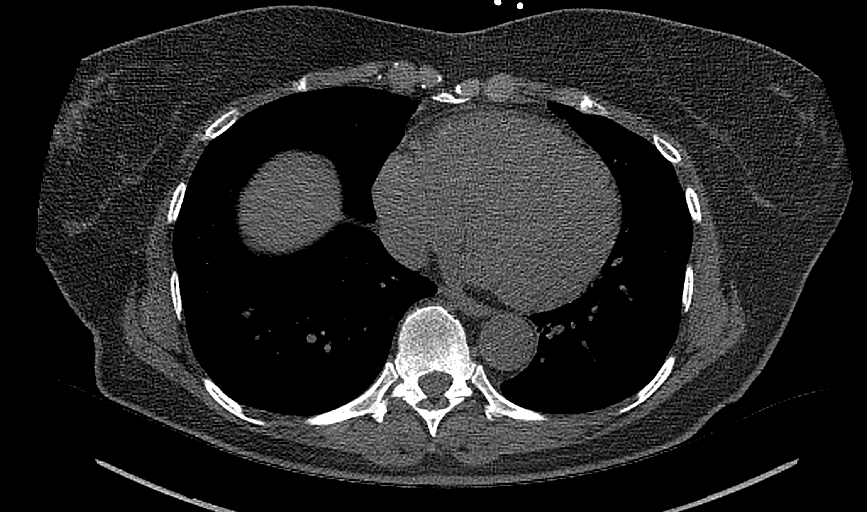
[im 45/90  vessel]
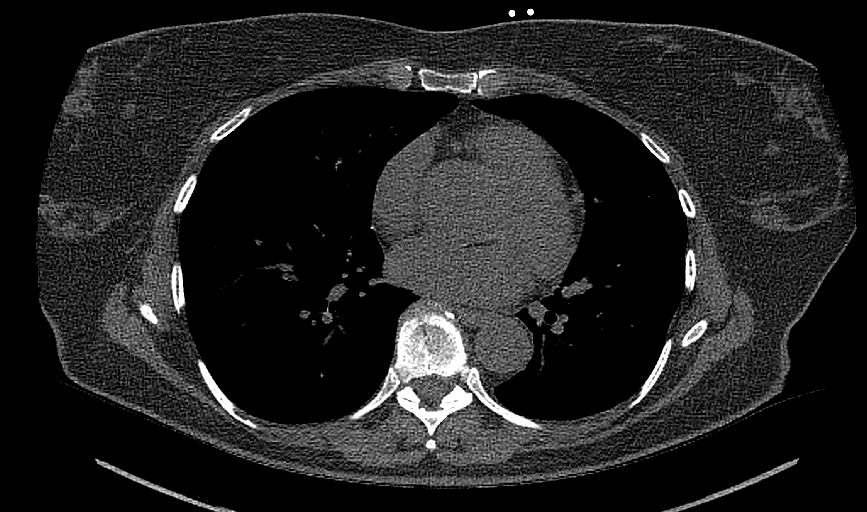
[im 60/90  vessel]
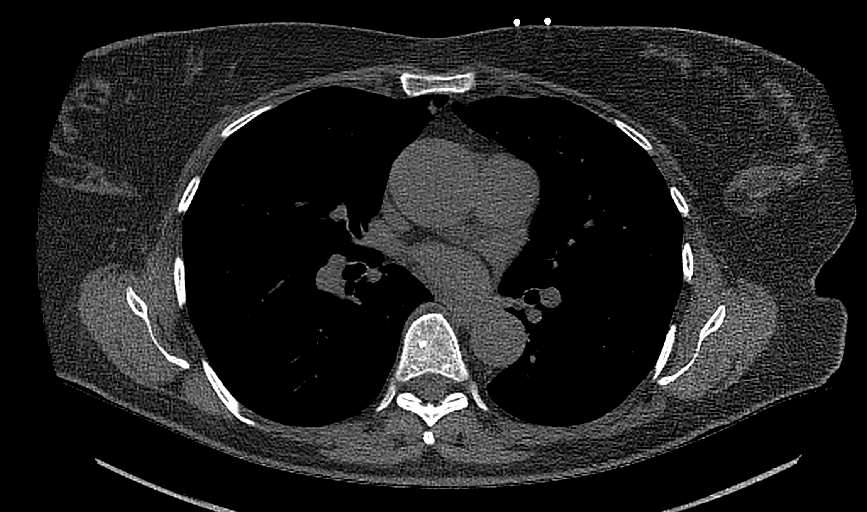

[9 of 20 positions shown; findings below may reference images not displayed]

FINDINGS: CORONARY CALCIUM SCORES:

Left Main: 0

LAD: 195

LCx: 0

RCA: 0

Total Agatston Score: 195

[HOSPITAL] percentile: 90

AORTA MEASUREMENTS:

Ascending Aorta: 41 mm

Descending Aorta: 26 mm

OTHER FINDINGS:

The heart size is within normal limits. No pericardial fluid is
identified. The ascending thoracic aorta demonstrates aneurysmal
disease with maximum caliber of approximately 4.1 cm. Visualized
central pulmonary arteries are normal in caliber. Visualized
mediastinum and hilar regions demonstrate no lymphadenopathy or
masses. Small hiatal hernia present. Visualized lungs show no
evidence of pulmonary edema, consolidation, pneumothorax, nodule or
pleural fluid. Visualized upper abdomen and bony structures are
unremarkable.
IMPRESSION: 1. Coronary calcium score of 195 is at the 90th percentile for the
patient's age, sex and race.
2. Aneurysmal disease of the ascending thoracic aorta with measured
maximal diameter of approximately 4.1 cm. Consider correlation with
echocardiography. Recommend annual imaging followup by CTA or MRA.
This recommendation follows 7272
ACCF/AHA/AATS/ACR/ASA/SCA/LEULMI/PICHARDO/EUROCLUBTRAVEL/KUFFER Guidelines for the
Diagnosis and Management of Patients with Thoracic Aortic Disease.
Circulation. 7272; 121: E266-e369. Aortic aneurysm NOS (1485J-12L.N)

## 2022-12-08 ENCOUNTER — Ambulatory Visit (HOSPITAL_BASED_OUTPATIENT_CLINIC_OR_DEPARTMENT_OTHER)
Admission: RE | Admit: 2022-12-08 | Discharge: 2022-12-08 | Disposition: A | Discharge status: Home / Self Care | Payer: BC Managed Care – PPO | Source: Ambulatory Visit | Attending: Cardiovascular Disease | Admitting: Cardiovascular Disease

## 2022-12-08 ENCOUNTER — Encounter (HOSPITAL_BASED_OUTPATIENT_CLINIC_OR_DEPARTMENT_OTHER): Payer: Self-pay

## 2022-12-08 DIAGNOSIS — I7121 Aneurysm of the ascending aorta, without rupture: Secondary | ICD-10-CM | POA: Diagnosis not present

## 2022-12-08 MED ORDER — IOHEXOL 350 MG/ML SOLN
100.0000 mL | Freq: Once | INTRAVENOUS | Status: AC | PRN
  Administered 2022-12-08: 75 mL via INTRAVENOUS

## 2022-12-09 ENCOUNTER — Ambulatory Visit: Payer: BC Managed Care – PPO | Admitting: Podiatry

## 2022-12-09 DIAGNOSIS — L603 Nail dystrophy: Secondary | ICD-10-CM | POA: Diagnosis not present

## 2022-12-09 DIAGNOSIS — B351 Tinea unguium: Secondary | ICD-10-CM | POA: Diagnosis not present

## 2022-12-09 DIAGNOSIS — E7800 Pure hypercholesterolemia, unspecified: Secondary | ICD-10-CM | POA: Insufficient documentation

## 2022-12-09 DIAGNOSIS — Z79899 Other long term (current) drug therapy: Secondary | ICD-10-CM

## 2022-12-09 DIAGNOSIS — I10 Essential (primary) hypertension: Secondary | ICD-10-CM | POA: Insufficient documentation

## 2022-12-09 DIAGNOSIS — M3500 Sicca syndrome, unspecified: Secondary | ICD-10-CM | POA: Insufficient documentation

## 2022-12-09 MED ORDER — EFINACONAZOLE 10 % EX SOLN: 1.0000 [drp] | Freq: Every day | CUTANEOUS | 11 refills | Status: DC

## 2022-12-09 NOTE — Progress Notes (Unsigned)
Subjective:   Patient ID: Heather Hahn, female   DOB: 67 y.o.   MRN: [id number]   HPI Chief Complaint  Patient presents with   Nail Problem     Patient states that she feels like she may have a nail fungus.    On the left git toenail she thinsk she has fungus Right 5th got dark brown and half camf off - she has hit the nail before Tried vicks   ROS      Objective:  Physical Exam  ***     Assessment:  ***     Plan:  ***

## 2022-12-09 NOTE — Patient Instructions (Signed)
You can also use UREA NAIL GEL on the thicker toenail.

## 2022-12-10 LAB — CBC WITH DIFFERENTIAL/PLATELET
Basophils Absolute: 0 10*3/uL (ref 0.0–0.2)
Basos: 1 %
EOS (ABSOLUTE): 0.1 10*3/uL (ref 0.0–0.4)
Eos: 3 %
Hematocrit: 41.1 % (ref 34.0–46.6)
Hemoglobin: 13.7 g/dL (ref 11.1–15.9)
Immature Grans (Abs): 0 10*3/uL (ref 0.0–0.1)
Immature Granulocytes: 0 %
Lymphocytes Absolute: 1.5 10*3/uL (ref 0.7–3.1)
Lymphs: 38 %
MCH: 30.2 pg (ref 26.6–33.0)
MCHC: 33.3 g/dL (ref 31.5–35.7)
MCV: 91 fL (ref 79–97)
Monocytes Absolute: 0.5 10*3/uL (ref 0.1–0.9)
Monocytes: 11 %
Neutrophils Absolute: 2 10*3/uL (ref 1.4–7.0)
Neutrophils: 47 %
Platelets: 225 10*3/uL (ref 150–450)
RBC: 4.53 x10E6/uL (ref 3.77–5.28)
RDW: 12.5 % (ref 11.7–15.4)
WBC: 4.1 10*3/uL (ref 3.4–10.8)

## 2022-12-10 LAB — HEPATIC FUNCTION PANEL
ALT: 30 IU/L (ref 0–32)
AST: 35 IU/L (ref 0–40)
Albumin: 4.6 g/dL (ref 3.9–4.9)
Alkaline Phosphatase: 79 IU/L (ref 44–121)
Bilirubin Total: 0.6 mg/dL (ref 0.0–1.2)
Bilirubin, Direct: 0.17 mg/dL (ref 0.00–0.40)
Total Protein: 7.3 g/dL (ref 6.0–8.5)

## 2022-12-12 ENCOUNTER — Other Ambulatory Visit: Payer: Self-pay | Admitting: Podiatry

## 2022-12-12 ENCOUNTER — Other Ambulatory Visit: Payer: Self-pay | Admitting: *Deleted

## 2022-12-12 DIAGNOSIS — Z79899 Other long term (current) drug therapy: Secondary | ICD-10-CM

## 2022-12-12 MED ORDER — ROSUVASTATIN CALCIUM 10 MG PO TABS: 10.0000 mg | ORAL_TABLET | Freq: Every day | ORAL | 0 refills | Status: DC

## 2022-12-12 MED ORDER — TERBINAFINE HCL 250 MG PO TABS: 250.0000 mg | ORAL_TABLET | Freq: Every day | ORAL | 0 refills | Status: DC

## 2022-12-28 DIAGNOSIS — H02421 Myogenic ptosis of right eyelid: Secondary | ICD-10-CM | POA: Diagnosis not present

## 2022-12-28 DIAGNOSIS — H18519 Endothelial corneal dystrophy, unspecified eye: Secondary | ICD-10-CM | POA: Diagnosis not present

## 2022-12-30 ENCOUNTER — Encounter: Payer: Self-pay | Admitting: Podiatry

## 2023-01-06 ENCOUNTER — Other Ambulatory Visit: Payer: Self-pay | Admitting: Podiatry

## 2023-01-06 DIAGNOSIS — H10501 Unspecified blepharoconjunctivitis, right eye: Secondary | ICD-10-CM | POA: Diagnosis not present

## 2023-01-06 MED ORDER — TAVABOROLE 5 % EX SOLN: 1.0000 [drp] | Freq: Every day | CUTANEOUS | 2 refills | Status: DC

## 2023-01-20 ENCOUNTER — Encounter: Payer: Self-pay | Admitting: Podiatry

## 2023-01-20 NOTE — Progress Notes (Signed)
Prior authorization for tavaborole case number is [id number]

## 2023-01-22 NOTE — Telephone Encounter (Signed)
Heather Hahn, if you are working in triage can you follow up on this? I had started he prior authorization. Thanks!

## 2023-02-01 ENCOUNTER — Telehealth: Payer: Self-pay

## 2023-02-01 NOTE — Telephone Encounter (Signed)
Prior Authorization for Tavaborole 5% solution sent through [employer]

## 2023-02-15 DIAGNOSIS — Z1231 Encounter for screening mammogram for malignant neoplasm of breast: Secondary | ICD-10-CM | POA: Diagnosis not present

## 2023-02-15 DIAGNOSIS — Z6823 Body mass index (BMI) 23.0-23.9, adult: Secondary | ICD-10-CM | POA: Diagnosis not present

## 2023-02-15 DIAGNOSIS — Z01419 Encounter for gynecological examination (general) (routine) without abnormal findings: Secondary | ICD-10-CM | POA: Diagnosis not present

## 2023-03-10 ENCOUNTER — Ambulatory Visit: Payer: BC Managed Care – PPO | Admitting: Podiatry

## 2023-03-12 ENCOUNTER — Other Ambulatory Visit: Payer: Self-pay | Admitting: Cardiovascular Disease

## 2023-04-28 ENCOUNTER — Ambulatory Visit: Payer: BC Managed Care – PPO | Admitting: Cardiovascular Disease

## 2023-06-08 ENCOUNTER — Other Ambulatory Visit: Payer: Self-pay | Admitting: Cardiovascular Disease

## 2023-10-09 ENCOUNTER — Ambulatory Visit: Payer: BC Managed Care – PPO | Admitting: Cardiovascular Disease

## 2023-12-11 ENCOUNTER — Other Ambulatory Visit: Payer: Self-pay | Admitting: Cardiovascular Disease

## 2023-12-12 NOTE — Telephone Encounter (Signed)
 Dr. Jeneal Mins pt. She was last seen on 02/28/22. She has had at least 4 appts set up and has cancelled all of them. Another appt is set for 03/01/24. Does Dr. Abel Hoe want to refill? Please advise.

## 2023-12-14 ENCOUNTER — Telehealth: Payer: Self-pay | Admitting: Cardiovascular Disease

## 2023-12-14 NOTE — Telephone Encounter (Signed)
*  STAT* If patient is at the pharmacy, call can be transferred to refill team.   1. Which medications need to be refilled? (please list name of each medication and dose if known) rosuvastatin  (CRESTOR ) 10 MG tablet   2. Which pharmacy/location (including street and city if local pharmacy) is medication to be sent to? [id number] - [place], [place] - [address]   3. Do they need a 30 day or 90 day supply? 90  Patient has appt on 8/8

## 2023-12-14 NOTE — Telephone Encounter (Signed)
 Refilled 90 days, no refill to get to appointment.

## 2024-03-01 ENCOUNTER — Ambulatory Visit: Attending: Cardiology | Admitting: Cardiovascular Disease

## 2024-03-01 VITALS — BP 152/78 | HR 76 | Ht 67.0 in | Wt 147.0 lb

## 2024-03-01 DIAGNOSIS — E7800 Pure hypercholesterolemia, unspecified: Secondary | ICD-10-CM | POA: Diagnosis not present

## 2024-03-01 DIAGNOSIS — I7121 Aneurysm of the ascending aorta, without rupture: Secondary | ICD-10-CM

## 2024-03-01 DIAGNOSIS — I2510 Atherosclerotic heart disease of native coronary artery without angina pectoris: Secondary | ICD-10-CM | POA: Diagnosis not present

## 2024-03-01 NOTE — Progress Notes (Signed)
 Chief Complaint  Patient presents with   Follow-up    CAD   History of Present Illness: 68 yo female with history of CAD by chest CT, anxiety, depression, HTN, mitral valve prolapse and dilated ascending aorta who is here today for follow up. CT coronary calcium  score in 2023 of 195 with mild dilation of the ascending aorta at 4.1 cm. Echo April 2023 with LVEF=55-60%, grade 1 diastolic dysfunction, normal RV function. Trivial MR. Trivial AI. Mild dilation of the aortic root. Normal exercise stress test. Chest CTA May 2024 with 4.0 cm ascending aorta.   She is here today for follow up. The patient denies any chest pain, dyspnea, palpitations, lower extremity edema, orthopnea, PND, dizziness, near syncope or syncope.   Primary Care Physician: Stephane Leita DEL, MD  Past Medical History:  Diagnosis Date   Anxiety    Depression    HTN (hypertension)    MVP (mitral valve prolapse)    Sjogren's syndrome (HCC)    SOB (shortness of breath)    Thoracic aortic aneurysm without rupture, unspecified part (HCC)    TMJ (dislocation of temporomandibular joint)     Past Surgical History:  Procedure Laterality Date   APPENDECTOMY     CESAREAN SECTION     DERMOID CYST  EXCISION     INGUINAL HERNIA REPAIR     LEEP     RIGHT OOPHORECTOMY     TONSILLECTOMY     TUBAL LIGATION      Current Outpatient Medications  Medication Sig Dispense Refill   aspirin-acetaminophen-caffeine (EXCEDRIN EXTRA STRENGTH) 250-250-65 MG tablet Take 1 tablet by mouth every morning.     estradiol (VIVELLE-DOT) 0.025 MG/24HR APPLY 1 PATCH TOPICALLY TO THE SKIN 2 TIMES A WEEK     famotidine (PEPCID) 20 MG tablet Take 20 mg by mouth at bedtime as needed.     lisinopril (ZESTRIL) 20 MG tablet Take 20 mg by mouth daily.     pantoprazole (PROTONIX) 20 MG tablet Take 20 mg by mouth daily.     rosuvastatin  (CRESTOR ) 10 MG tablet Take 1 tablet (10 mg total) by mouth daily. Please keep upcoming appointment in Aug for further  refills! 90 tablet 0   No current facility-administered medications for this visit.    Allergies  Allergen Reactions   Sulfa Antibiotics     Social History   Socioeconomic History   Marital status: Married    Spouse name: Not on file   Number of children: 2   Years of education: Not on file   Highest education level: Not on file  Occupational History   Occupation: Retired-[employer]  Tobacco Use   Smoking status: Never   Smokeless tobacco: Never  Substance and Sexual Activity   Alcohol  use: Yes    Alcohol /week: 1.0 standard drink of alcohol     Types: 1 Glasses of wine per week    Comment: 1 glass of red wine per day   Drug use: Never   Sexual activity: Not on file  Other Topics Concern   Not on file  Social History Narrative   Not on file   Social Drivers of Health   Financial Resource Strain: Not on file  Food Insecurity: Not on file  Transportation Needs: Not on file  Physical Activity: Not on file  Stress: Not on file  Social Connections: Not on file  Intimate Partner Violence: Not on file    Family History  Problem Relation Age of Onset   Hypertension Mother  Diabetes Mother    Stroke Mother    Heart failure Mother    Hypertension Father    Dementia Father     Review of Systems:  As stated in the HPI and otherwise negative.   BP (!) 152/78   Pulse 76   Ht 5' 7 (1.702 m)   Wt 147 lb (66.7 kg)   SpO2 98%   BMI 23.02 kg/m   Physical Examination:  General: Well developed, well nourished, NAD  HEENT: OP clear, mucus membranes moist  SKIN: warm, dry. No rashes. Neuro: No focal deficits  Musculoskeletal: Muscle strength 5/5 all ext  Psychiatric: Mood and affect normal  Neck: No JVD, no carotid bruits, no thyromegaly, no lymphadenopathy.  Lungs:Clear bilaterally, no wheezes, rhonci, crackles Cardiovascular: Regular rate and rhythm. No murmurs, gallops or rubs. Abdomen:Soft. Bowel sounds present. Non-tender.  Extremities: No lower extremity edema.  Pulses are 2 + in the bilateral DP/PT.  EKG:  EKG is ordered today. The ekg ordered today demonstrates  EKG Interpretation Date/Time:  Friday March 01 2024 10:16:53 EDT Ventricular Rate:  71 PR Interval:  172 QRS Duration:  90 QT Interval:  374 QTC Calculation: 406 R Axis:   79  Text Interpretation: Sinus rhythm with occasional Premature ventricular complexes Confirmed by Verlin Bruckner [phone]) on 03/01/2024 10:20:03 AM    Echo April 2023:  1. Left ventricular ejection fraction, by estimation, is 55 to 60%. The  left ventricle has normal function. The left ventricle has no regional  wall motion abnormalities. Left ventricular diastolic parameters are  consistent with Grade I diastolic  dysfunction (impaired relaxation). The average left ventricular global  longitudinal strain is -19.7 %. The global longitudinal strain is normal.   2. Right ventricular systolic function is normal. The right ventricular  size is normal. Tricuspid regurgitation signal is inadequate for assessing  PA pressure.   3. The mitral valve is normal in structure. Trivial mitral valve  regurgitation. No evidence of mitral stenosis.   4. The aortic valve is tricuspid. Aortic valve regurgitation is trivial.  No aortic stenosis is present.   5. Aortic dilatation noted. There is mild dilatation of the aortic root  and of the ascending aorta, measuring 41 mm.   6. The inferior vena cava is normal in size with greater than 50%  respiratory variability, suggesting right atrial pressure of 3 mmHg.  Recent Labs: No results found for requested labs within last 365 days.   Lipid Panel No results found for: CHOL, TRIG, HDL, CHOLHDL, VLDL, LDLCALC, LDLDIRECT   Wt Readings from Last 3 Encounters:  03/01/24 147 lb (66.7 kg)  02/28/22 155 lb 6.4 oz (70.5 kg)  10/28/21 152 lb (68.9 kg)    Assessment and Plan:   1. CAD without angina: Abnormal coronary calcium  score. Echo in 2023 with normal LV  function. Exercise stress test in 2023 without ischemia. No chest pain. Continue ASA and statin.    2. Thoracic aortic aneurysm: Ascending aorta 4.0 cm by chest CT in May 2024. Will arrange a chest MRA to assess size.   3. Hyperlipidemia: LDL 78 in February 2025 in primary care. Goal LDL under 70. She did not tolerate Crestor  20 mg daily. She does not wish to go back to 20 mg of Crestor . She may consider Zetia.   Labs/ tests ordered today include:   Orders Placed This Encounter  Procedures   MR Angiogram Chest W Wo Contrast   EKG 12-Lead   Disposition:   F/U with me  in 12 months   Signed, Lonni Cash, MD 03/01/2024 10:52 AM    [facility] [address] [place], [place], [place]  [address] Phone: [phone]; Fax: [phone]

## 2024-03-01 NOTE — Patient Instructions (Signed)
 Medications: No medication changes were made at this visit. Continue current regimen.   Testing/Procedures: Your provider wants you to get a Chest MRA.  Follow-Up: At [facility], you and your health needs are our priority.  As part of our continuing mission to provide you with exceptional heart care, our providers are all part of one team.  This team includes your primary Cardiologist (physician) and Advanced Practice Providers or APPs (Physician Assistants and Nurse Practitioners) who all work together to provide you with the care you need, when you need it.  Your next appointment:   1 year(s)  Provider:   Lonni Cash, MD    We recommend signing up for the patient portal called MyChart.  Sign up information is provided on this After Visit Summary.  MyChart is used to connect with patients for Virtual Visits (Telemedicine).  Patients are able to view lab/test results, encounter notes, upcoming appointments, etc.  Non-urgent messages can be sent to your provider as well.   To learn more about what you can do with MyChart, go to [email or web address].

## 2024-03-08 ENCOUNTER — Ambulatory Visit (HOSPITAL_COMMUNITY)

## 2024-03-09 ENCOUNTER — Other Ambulatory Visit: Payer: Self-pay | Admitting: Cardiovascular Disease

## 2024-03-29 ENCOUNTER — Ambulatory Visit (HOSPITAL_COMMUNITY)
Admission: RE | Admit: 2024-03-29 | Discharge: 2024-03-29 | Disposition: A | Discharge status: Home / Self Care | Source: Ambulatory Visit | Attending: Cardiovascular Disease | Admitting: Cardiovascular Disease

## 2024-03-29 DIAGNOSIS — I7121 Aneurysm of the ascending aorta, without rupture: Secondary | ICD-10-CM | POA: Diagnosis present

## 2024-03-29 MED ORDER — GADOBUTROL 1 MMOL/ML IV SOLN
7.5000 mL | Freq: Once | INTRAVENOUS | Status: AC | PRN
  Administered 2024-03-29: 7.5 mL via INTRAVENOUS

## 2024-04-03 ENCOUNTER — Ambulatory Visit: Payer: Self-pay | Admitting: Cardiovascular Disease

## 2024-04-03 NOTE — Telephone Encounter (Signed)
-----   Message from Lonni Cash sent at 04/03/2024  3:08 PM EDT ----- Unchanged size of her ascending aorta. This still measures 4.0 cm which is mildly abnormal. Can we let her know? Thanks,chris ----- Message ----- From: Interface, Rad Results In Sent: 04/03/2024   2:47 PM EDT To: Lonni JONETTA Cash, MD

## 2024-04-03 NOTE — Telephone Encounter (Signed)
 Call to patient to advise that echo showedUnchanged size of her ascending aorta. Patient verbalizes understanding that This still measures 4.0 cm which is mildly abnormal.
